# Patient Record
Sex: Male | Born: 1965 | Race: White | Hispanic: No | Marital: Single | Smoking: Never smoker
Health system: Southern US, Community
[De-identification: ages and names within clinical notes are randomized; demographics above are authoritative.]

---

## 2002-02-02 HISTORY — PX: HERNIA REPAIR: SHX51

## 2006-01-14 ENCOUNTER — Emergency Department (HOSPITAL_COMMUNITY): Admission: EM | Admit: 2006-01-14 | Discharge: 2006-01-14 | Payer: Self-pay | Admitting: Emergency Medicine

## 2018-06-06 ENCOUNTER — Encounter (HOSPITAL_COMMUNITY)
Admission: EM | Disposition: A | Discharge status: Home / Self Care | Payer: Self-pay | Source: Home / Self Care | Attending: Emergency Medicine

## 2018-06-06 ENCOUNTER — Encounter (HOSPITAL_COMMUNITY): Payer: Self-pay

## 2018-06-06 ENCOUNTER — Other Ambulatory Visit: Payer: Self-pay

## 2018-06-06 ENCOUNTER — Emergency Department (HOSPITAL_COMMUNITY): Payer: Self-pay

## 2018-06-06 ENCOUNTER — Emergency Department (HOSPITAL_COMMUNITY): Payer: Self-pay | Admitting: Certified Registered"

## 2018-06-06 ENCOUNTER — Observation Stay (HOSPITAL_COMMUNITY)
Admission: EM | Admit: 2018-06-06 | Discharge: 2018-06-07 | Disposition: A | Discharge status: Home / Self Care | Payer: Self-pay | Attending: Surgery | Admitting: Surgery

## 2018-06-06 DIAGNOSIS — K219 Gastro-esophageal reflux disease without esophagitis: Secondary | ICD-10-CM | POA: Insufficient documentation

## 2018-06-06 DIAGNOSIS — K3580 Unspecified acute appendicitis: Principal | ICD-10-CM | POA: Diagnosis present

## 2018-06-06 HISTORY — PX: LAPAROSCOPIC APPENDECTOMY: SHX408

## 2018-06-06 LAB — CBC
HCT: 41.7 % (ref 39.0–52.0)
Hemoglobin: 13.7 g/dL (ref 13.0–17.0)
MCH: 28.2 pg (ref 26.0–34.0)
MCHC: 32.9 g/dL (ref 30.0–36.0)
MCV: 85.8 fL (ref 80.0–100.0)
Platelets: 195 10*3/uL (ref 150–400)
RBC: 4.86 MIL/uL (ref 4.22–5.81)
RDW: 13 % (ref 11.5–15.5)
WBC: 11.7 10*3/uL — ABNORMAL HIGH (ref 4.0–10.5)
nRBC: 0 % (ref 0.0–0.2)

## 2018-06-06 LAB — URINALYSIS, ROUTINE W REFLEX MICROSCOPIC
Bilirubin Urine: NEGATIVE
Glucose, UA: NEGATIVE mg/dL
Hgb urine dipstick: NEGATIVE
Ketones, ur: NEGATIVE mg/dL
Leukocytes,Ua: NEGATIVE
Nitrite: NEGATIVE
Protein, ur: NEGATIVE mg/dL
Specific Gravity, Urine: 1.025 (ref 1.005–1.030)
pH: 5 (ref 5.0–8.0)

## 2018-06-06 LAB — COMPREHENSIVE METABOLIC PANEL
ALT: 17 U/L (ref 0–44)
AST: 17 U/L (ref 15–41)
Albumin: 4.3 g/dL (ref 3.5–5.0)
Alkaline Phosphatase: 48 U/L (ref 38–126)
Anion gap: 8 (ref 5–15)
BUN: 13 mg/dL (ref 6–20)
CO2: 25 mmol/L (ref 22–32)
Calcium: 9.1 mg/dL (ref 8.9–10.3)
Chloride: 104 mmol/L (ref 98–111)
Creatinine, Ser: 1.04 mg/dL (ref 0.61–1.24)
GFR calc Af Amer: >60 mL/min (ref >60)
GFR calc non Af Amer: >60 mL/min (ref >60)
Glucose, Bld: 103 mg/dL — ABNORMAL HIGH (ref 70–99)
Potassium: 3.9 mmol/L (ref 3.5–5.1)
Sodium: 137 mmol/L (ref 135–145)
Total Bilirubin: 0.6 mg/dL (ref 0.3–1.2)
Total Protein: 7.6 g/dL (ref 6.5–8.1)

## 2018-06-06 LAB — LIPASE, BLOOD: Lipase: 27 U/L (ref 11–51)

## 2018-06-06 SURGERY — APPENDECTOMY, LAPAROSCOPIC
Anesthesia: General | Site: Abdomen

## 2018-06-06 MED ORDER — MIDAZOLAM HCL 2 MG/2ML IJ SOLN
INTRAMUSCULAR | Status: DC | PRN
  Administered 2018-06-06: 2 mg via INTRAVENOUS

## 2018-06-06 MED ORDER — SUGAMMADEX SODIUM 200 MG/2ML IV SOLN
INTRAVENOUS | Status: AC
  Filled 2018-06-06: qty 2

## 2018-06-06 MED ORDER — ONDANSETRON 4 MG PO TBDP: 4.0000 mg | ORAL_TABLET | Freq: Four times a day (QID) | ORAL | Status: DC | PRN

## 2018-06-06 MED ORDER — BUPIVACAINE-EPINEPHRINE 0.5% -1:200000 IJ SOLN
INTRAMUSCULAR | Status: AC
  Filled 2018-06-06: qty 1

## 2018-06-06 MED ORDER — DEXAMETHASONE SODIUM PHOSPHATE 10 MG/ML IJ SOLN
INTRAMUSCULAR | Status: DC | PRN
  Administered 2018-06-06: 10 mg via INTRAVENOUS

## 2018-06-06 MED ORDER — SODIUM CHLORIDE 0.9 % IV SOLN
2.0000 g | Freq: Once | INTRAVENOUS | Status: AC
  Administered 2018-06-06: 2 g via INTRAVENOUS
  Filled 2018-06-06: qty 20

## 2018-06-06 MED ORDER — ONDANSETRON HCL 4 MG/2ML IJ SOLN
INTRAMUSCULAR | Status: AC
  Filled 2018-06-06: qty 2

## 2018-06-06 MED ORDER — LIDOCAINE 2% (20 MG/ML) 5 ML SYRINGE
INTRAMUSCULAR | Status: AC
  Filled 2018-06-06: qty 5

## 2018-06-06 MED ORDER — BISACODYL 10 MG RE SUPP: 10.0000 mg | Freq: Every day | RECTAL | Status: DC | PRN

## 2018-06-06 MED ORDER — HYDROCODONE-ACETAMINOPHEN 5-325 MG PO TABS: 1.0000 | ORAL_TABLET | ORAL | Status: DC | PRN

## 2018-06-06 MED ORDER — ACETAMINOPHEN 500 MG PO TABS
1000.0000 mg | ORAL_TABLET | Freq: Four times a day (QID) | ORAL | Status: DC
  Administered 2018-06-06 – 2018-06-07 (×3): 1000 mg via ORAL
  Filled 2018-06-06 (×3): qty 2

## 2018-06-06 MED ORDER — FENTANYL CITRATE (PF) 100 MCG/2ML IJ SOLN
INTRAMUSCULAR | Status: AC
  Filled 2018-06-06: qty 2

## 2018-06-06 MED ORDER — MORPHINE SULFATE (PF) 4 MG/ML IV SOLN
2.0000 mg | INTRAVENOUS | Status: DC | PRN
  Administered 2018-06-06: 21:00:00 2 mg via INTRAVENOUS
  Administered 2018-06-07: 4 mg via INTRAVENOUS
  Filled 2018-06-06 (×2): qty 1

## 2018-06-06 MED ORDER — SODIUM CHLORIDE 0.9% FLUSH
3.0000 mL | Freq: Once | INTRAVENOUS | Status: AC
  Administered 2018-06-06: 3 mL via INTRAVENOUS

## 2018-06-06 MED ORDER — ONDANSETRON HCL 4 MG/2ML IJ SOLN
INTRAMUSCULAR | Status: DC | PRN
  Administered 2018-06-06: 4 mg via INTRAVENOUS

## 2018-06-06 MED ORDER — PROPOFOL 10 MG/ML IV BOLUS
INTRAVENOUS | Status: AC
  Filled 2018-06-06: qty 20

## 2018-06-06 MED ORDER — FENTANYL CITRATE (PF) 100 MCG/2ML IJ SOLN: 25.0000 ug | INTRAMUSCULAR | Status: DC | PRN

## 2018-06-06 MED ORDER — GABAPENTIN 300 MG PO CAPS
300.0000 mg | ORAL_CAPSULE | Freq: Two times a day (BID) | ORAL | Status: DC
  Administered 2018-06-06 – 2018-06-07 (×2): 300 mg via ORAL
  Filled 2018-06-06 (×2): qty 1

## 2018-06-06 MED ORDER — SUGAMMADEX SODIUM 500 MG/5ML IV SOLN
INTRAVENOUS | Status: AC
  Filled 2018-06-06: qty 5

## 2018-06-06 MED ORDER — PROPOFOL 10 MG/ML IV BOLUS
INTRAVENOUS | Status: DC | PRN
  Administered 2018-06-06: 200 mg via INTRAVENOUS

## 2018-06-06 MED ORDER — SODIUM CHLORIDE (PF) 0.9 % IJ SOLN
INTRAMUSCULAR | Status: AC
  Filled 2018-06-06: qty 50

## 2018-06-06 MED ORDER — METRONIDAZOLE IN NACL 5-0.79 MG/ML-% IV SOLN
500.0000 mg | Freq: Three times a day (TID) | INTRAVENOUS | Status: AC
  Administered 2018-06-07: 03:00:00 500 mg via INTRAVENOUS
  Filled 2018-06-06: qty 100

## 2018-06-06 MED ORDER — IBUPROFEN 200 MG PO TABS: 600.0000 mg | ORAL_TABLET | Freq: Four times a day (QID) | ORAL | Status: DC | PRN

## 2018-06-06 MED ORDER — SIMETHICONE 80 MG PO CHEW: 40.0000 mg | CHEWABLE_TABLET | Freq: Four times a day (QID) | ORAL | Status: DC | PRN

## 2018-06-06 MED ORDER — BUPIVACAINE-EPINEPHRINE 0.5% -1:200000 IJ SOLN
INTRAMUSCULAR | Status: DC | PRN
  Administered 2018-06-06: 20 mL

## 2018-06-06 MED ORDER — METRONIDAZOLE IN NACL 5-0.79 MG/ML-% IV SOLN: 500.0000 mg | Freq: Three times a day (TID) | INTRAVENOUS | Status: DC

## 2018-06-06 MED ORDER — FENTANYL CITRATE (PF) 250 MCG/5ML IJ SOLN
INTRAMUSCULAR | Status: DC | PRN
  Administered 2018-06-06 (×2): 100 ug via INTRAVENOUS
  Administered 2018-06-06: 50 ug via INTRAVENOUS

## 2018-06-06 MED ORDER — METRONIDAZOLE IN NACL 5-0.79 MG/ML-% IV SOLN
500.0000 mg | Freq: Once | INTRAVENOUS | Status: AC
  Administered 2018-06-06: 500 mg via INTRAVENOUS
  Filled 2018-06-06: qty 100

## 2018-06-06 MED ORDER — ONDANSETRON HCL 4 MG/2ML IJ SOLN: 4.0000 mg | Freq: Four times a day (QID) | INTRAMUSCULAR | Status: DC | PRN

## 2018-06-06 MED ORDER — LACTATED RINGERS IV SOLN
INTRAVENOUS | Status: DC
  Administered 2018-06-06 (×2): via INTRAVENOUS

## 2018-06-06 MED ORDER — ROCURONIUM BROMIDE 10 MG/ML (PF) SYRINGE
PREFILLED_SYRINGE | INTRAVENOUS | Status: AC
  Filled 2018-06-06: qty 10

## 2018-06-06 MED ORDER — ROCURONIUM BROMIDE 10 MG/ML (PF) SYRINGE
PREFILLED_SYRINGE | INTRAVENOUS | Status: DC | PRN
  Administered 2018-06-06: 50 mg via INTRAVENOUS

## 2018-06-06 MED ORDER — SUCCINYLCHOLINE CHLORIDE 200 MG/10ML IV SOSY
PREFILLED_SYRINGE | INTRAVENOUS | Status: DC | PRN
  Administered 2018-06-06: 200 mg via INTRAVENOUS

## 2018-06-06 MED ORDER — SENNOSIDES-DOCUSATE SODIUM 8.6-50 MG PO TABS
1.0000 | ORAL_TABLET | Freq: Every day | ORAL | Status: DC
  Administered 2018-06-06: 1 via ORAL
  Filled 2018-06-06: qty 1

## 2018-06-06 MED ORDER — MIDAZOLAM HCL 2 MG/2ML IJ SOLN
INTRAMUSCULAR | Status: AC
  Filled 2018-06-06: qty 2

## 2018-06-06 MED ORDER — IOHEXOL 300 MG/ML  SOLN
100.0000 mL | Freq: Once | INTRAMUSCULAR | Status: AC | PRN
  Administered 2018-06-06: 100 mL via INTRAVENOUS

## 2018-06-06 MED ORDER — SODIUM CHLORIDE 0.9 % IV BOLUS: 500.0000 mL | Freq: Once | INTRAVENOUS | Status: DC

## 2018-06-06 MED ORDER — KCL IN DEXTROSE-NACL 20-5-0.45 MEQ/L-%-% IV SOLN
INTRAVENOUS | Status: DC
  Administered 2018-06-06: 23:00:00 via INTRAVENOUS
  Filled 2018-06-06: qty 1000

## 2018-06-06 MED ORDER — FENTANYL CITRATE (PF) 250 MCG/5ML IJ SOLN
INTRAMUSCULAR | Status: AC
  Filled 2018-06-06: qty 5

## 2018-06-06 MED ORDER — ENOXAPARIN SODIUM 40 MG/0.4ML ~~LOC~~ SOLN
40.0000 mg | SUBCUTANEOUS | Status: DC
  Administered 2018-06-07: 08:00:00 40 mg via SUBCUTANEOUS
  Filled 2018-06-06: qty 0.4

## 2018-06-06 MED ORDER — SUGAMMADEX SODIUM 200 MG/2ML IV SOLN
INTRAVENOUS | Status: DC | PRN
  Administered 2018-06-06: 350 mg via INTRAVENOUS

## 2018-06-06 MED ORDER — LIDOCAINE 2% (20 MG/ML) 5 ML SYRINGE
INTRAMUSCULAR | Status: DC | PRN
  Administered 2018-06-06: 80 mg via INTRAVENOUS

## 2018-06-06 MED ORDER — DEXAMETHASONE SODIUM PHOSPHATE 10 MG/ML IJ SOLN
INTRAMUSCULAR | Status: AC
  Filled 2018-06-06: qty 1

## 2018-06-06 SURGICAL SUPPLY — 41 items
APPLIER CLIP 5 13 M/L LIGAMAX5 (MISCELLANEOUS)
APPLIER CLIP ROT 10 11.4 M/L (STAPLE)
CABLE HIGH FREQUENCY MONO STRZ (ELECTRODE) ×3 IMPLANT
CHLORAPREP W/TINT 26 (MISCELLANEOUS) ×3 IMPLANT
CLIP APPLIE 5 13 M/L LIGAMAX5 (MISCELLANEOUS) IMPLANT
CLIP APPLIE ROT 10 11.4 M/L (STAPLE) IMPLANT
COVER WAND RF STERILE (DRAPES) IMPLANT
DECANTER SPIKE VIAL GLASS SM (MISCELLANEOUS) ×3 IMPLANT
DERMABOND ADVANCED (GAUZE/BANDAGES/DRESSINGS) ×2
DERMABOND ADVANCED .7 DNX12 (GAUZE/BANDAGES/DRESSINGS) ×1 IMPLANT
DRAPE LAPAROSCOPIC ABDOMINAL (DRAPES) IMPLANT
ELECT REM PT RETURN 15FT ADLT (MISCELLANEOUS) ×3 IMPLANT
GLOVE BIO SURGEON STRL SZ 6.5 (GLOVE) ×2 IMPLANT
GLOVE BIO SURGEONS STRL SZ 6.5 (GLOVE) ×1
GLOVE BIOGEL PI IND STRL 7.0 (GLOVE) ×1 IMPLANT
GLOVE BIOGEL PI INDICATOR 7.0 (GLOVE) ×2
GOWN STRL REUS W/TWL 2XL LVL3 (GOWN DISPOSABLE) ×3 IMPLANT
GOWN STRL REUS W/TWL XL LVL3 (GOWN DISPOSABLE) ×3 IMPLANT
GRASPER SUT TROCAR 14GX15 (MISCELLANEOUS) IMPLANT
HANDLE STAPLE EGIA 4 XL (STAPLE) ×3 IMPLANT
IRRIG SUCT STRYKERFLOW 2 WTIP (MISCELLANEOUS) ×3
IRRIGATION SUCT STRKRFLW 2 WTP (MISCELLANEOUS) ×1 IMPLANT
KIT BASIN OR (CUSTOM PROCEDURE TRAY) ×3 IMPLANT
KIT TURNOVER KIT A (KITS) IMPLANT
MARKER SKIN DUAL TIP RULER LAB (MISCELLANEOUS) IMPLANT
POUCH SPECIMEN RETRIEVAL 10MM (ENDOMECHANICALS) IMPLANT
RELOAD EGIA 45 MED/THCK PURPLE (STAPLE) ×3 IMPLANT
RELOAD EGIA 45 TAN VASC (STAPLE) ×3 IMPLANT
RELOAD EGIA 60 MED/THCK PURPLE (STAPLE) IMPLANT
RELOAD EGIA 60 TAN VASC (STAPLE) IMPLANT
SCISSORS LAP 5X35 DISP (ENDOMECHANICALS) IMPLANT
SLEEVE XCEL OPT CAN 5 100 (ENDOMECHANICALS) IMPLANT
SUT VIC AB 2-0 SH 27 (SUTURE)
SUT VIC AB 2-0 SH 27X BRD (SUTURE) IMPLANT
SUT VIC AB 4-0 PS2 27 (SUTURE) ×3 IMPLANT
SUT VICRYL 0 UR6 27IN ABS (SUTURE) IMPLANT
TOWEL OR 17X26 10 PK STRL BLUE (TOWEL DISPOSABLE) ×3 IMPLANT
TRAY FOLEY MTR SLVR 16FR STAT (SET/KITS/TRAYS/PACK) ×3 IMPLANT
TRAY LAPAROSCOPIC (CUSTOM PROCEDURE TRAY) ×3 IMPLANT
TROCAR BLADELESS OPT 5 100 (ENDOMECHANICALS) IMPLANT
TROCAR XCEL BLUNT TIP 100MML (ENDOMECHANICALS) ×3 IMPLANT

## 2018-06-06 NOTE — ED Triage Notes (Signed)
Pt states that he is having generalized abdominal pain, that has since moved to RLQ pain. Pt is concerned for appendicitis. Pain started yesterday at 2100.

## 2018-06-06 NOTE — Progress Notes (Signed)
Patient arrived to room 1519 at 2007 via stretcher accompanied by PACU personnel and received bedside report. Patient alert and oriented x4. Complaints of abdominal pain 4/10 on pain scale. SCD's reapplied. PIV to left arm patent and receiving IVF's LR. Abdomen with three ports open to air with surgical glue intact. NT in to obtain VS's. Requested for pain to call for assistance for elimination needs. Safety measures initiated: bed alarm on, bed low position, call light within reach, and SR up x 2.

## 2018-06-06 NOTE — Anesthesia Preprocedure Evaluation (Addendum)
Anesthesia Evaluation  Patient identified by MRN, date of birth, ID band Patient awake    Reviewed: Allergy & Precautions, NPO status , Patient's Chart, lab work & pertinent test results  History of Anesthesia Complications Negative for: history of anesthetic complications  Airway Mallampati: II  TM Distance: >3 FB Neck ROM: Full    Dental  (+) Dental Advisory Given, Chipped,    Pulmonary neg pulmonary ROS,    Pulmonary exam normal breath sounds clear to auscultation       Cardiovascular negative cardio ROS Normal cardiovascular exam Rhythm:Regular Rate:Normal     Neuro/Psych negative neurological ROS     GI/Hepatic Neg liver ROS, GERD  Medicated and Controlled,Acute appendicitis   Endo/Other  negative endocrine ROS  Renal/GU negative Renal ROS     Musculoskeletal negative musculoskeletal ROS (+)   Abdominal   Peds  Hematology negative hematology ROS (+)   Anesthesia Other Findings Day of surgery medications reviewed with the patient.  Reproductive/Obstetrics                            Anesthesia Physical Anesthesia Plan  ASA: II  Anesthesia Plan: General   Post-op Pain Management:    Induction: Intravenous, Rapid sequence and Cricoid pressure planned  PONV Risk Score and Plan: 3 and Treatment may vary due to age or medical condition, Ondansetron, Dexamethasone and Midazolam  Airway Management Planned: Oral ETT  Additional Equipment: None  Intra-op Plan:   Post-operative Plan: Extubation in OR  Informed Consent: I have reviewed the patients History and Physical, chart, labs and discussed the procedure including the risks, benefits and alternatives for the proposed anesthesia with the patient or authorized representative who has indicated his/her understanding and acceptance.     Dental advisory given  Plan Discussed with: CRNA  Anesthesia Plan Comments:         Anesthesia Quick Evaluation

## 2018-06-06 NOTE — ED Notes (Signed)
[facility] CONTACT INFORMATION [phone] PLEASE CALL AFTER SURGERY

## 2018-06-06 NOTE — ED Provider Notes (Signed)
[facility]-EMERGENCY DEPT Provider Note   CSN: [id number] Arrival date & time: 06/06/18  1310    History   Chief Complaint Chief Complaint  Patient presents with   Abdominal Pain    HPI William Chavez is a 53 y.o. male.     HPI  Patient is a 53 year old male with a past surgical history of hernia repair 2004 but no significant past medical history presenting for generalized abdominal pain that progressed to the right lower quadrant today.  He reports that his pain began rather suddenly around 9 PM last night.  He reports his pain was generalized and at various points throughout the night it was crampy, sharp, and dull.  Reports he was unable to get much sleep.  He vomited 1 time, nonbilious nonbloody.  Patient reports that today his pain progressed to the right lower quadrant.  He reports that the overall level of his pain improved, and if he does not move he is comfortable.  Reports last bowel movement was in the middle last night and normal for patient without melena or hematochezia.  He is passing gas.  Denies fevers or chills.  Took ibuprofen 2 hours ago which helped his symptoms.  History reviewed. No pertinent past medical history.  There are no active problems to display for this patient.   Past Surgical History:  Procedure Laterality Date   HERNIA REPAIR  2004        Home Medications    Prior to Admission medications   Not on File    Family History No family history on file.  Social History Social History   Tobacco Use   Smoking status: Never Smoker   Smokeless tobacco: Never Used  Substance Use Topics   Alcohol use: Never    Frequency: Never   Drug use: Never     Allergies   Patient has no known allergies.   Review of Systems Review of Systems  Constitutional: Negative for chills and fever.  HENT: Negative for congestion and sore throat.   Eyes: Negative for visual disturbance.  Respiratory: Negative for cough,  chest tightness and shortness of breath.   Cardiovascular: Negative for chest pain.  Gastrointestinal: Positive for abdominal pain, nausea and vomiting. Negative for blood in stool, constipation and diarrhea.  Genitourinary: Negative for difficulty urinating, dysuria, flank pain and frequency.  Musculoskeletal: Negative for back pain and myalgias.  Skin: Negative for rash.  Neurological: Negative for dizziness, syncope and headaches.     Physical Exam Updated Vital Signs BP 138/82 (BP Location: Right Arm)    Pulse 67    Temp 99.3 F (37.4 C) (Oral)    Resp 16    Ht 6' (1.829 m)    Wt 95.3 kg    SpO2 100%    BMI 28.48 kg/m   Physical Exam Vitals signs and nursing note reviewed.  Constitutional:      General: He is not in acute distress.    Appearance: He is well-developed.  HENT:     Head: Normocephalic and atraumatic.  Eyes:     Conjunctiva/sclera: Conjunctivae normal.     Pupils: Pupils are equal, round, and reactive to light.  Neck:     Musculoskeletal: Normal range of motion and neck supple.  Cardiovascular:     Rate and Rhythm: Normal rate and regular rhythm.     Heart sounds: S1 normal and S2 normal. No murmur.  Pulmonary:     Effort: Pulmonary effort is normal.  Breath sounds: Normal breath sounds. No wheezing or rales.  Abdominal:     General: There is no distension.     Palpations: Abdomen is soft.     Tenderness: There is abdominal tenderness in the right lower quadrant. There is no guarding or rebound. Positive signs include Rovsing's sign and McBurney's sign.  Musculoskeletal: Normal range of motion.        General: No deformity.  Lymphadenopathy:     Cervical: No cervical adenopathy.  Skin:    General: Skin is warm and dry.     Findings: No erythema or rash.  Neurological:     Mental Status: He is alert.     Comments: Cranial nerves grossly intact. Patient moves extremities symmetrically and with good coordination.  Psychiatric:        Behavior:  Behavior normal.        Thought Content: Thought content normal.        Judgment: Judgment normal.      ED Treatments / Results  Labs (all labs ordered are listed, but only abnormal results are displayed) Labs Reviewed  COMPREHENSIVE METABOLIC PANEL - Abnormal; Notable for the following components:      Result Value   Glucose, Bld 103 (*)    All other components within normal limits  CBC - Abnormal; Notable for the following components:   WBC 11.7 (*)    All other components within normal limits  URINALYSIS, ROUTINE W REFLEX MICROSCOPIC - Abnormal; Notable for the following components:   APPearance HAZY (*)    All other components within normal limits  LIPASE, BLOOD    EKG None  Radiology Ct Abdomen Pelvis W Contrast  Result Date: 06/06/2018 CLINICAL DATA:  Generalized abdominal pain yesterday which since moved to the right lower quadrant. Concern for appendicitis. EXAM: CT ABDOMEN AND PELVIS WITH CONTRAST TECHNIQUE: Multidetector CT imaging of the abdomen and pelvis was performed using the standard protocol following bolus administration of intravenous contrast. CONTRAST:  100mL OMNIPAQUE IOHEXOL 300 MG/ML  SOLN COMPARISON:  None. FINDINGS: Lower chest: Lung bases demonstrate subtle dependent left basilar atelectasis. Hepatobiliary: Liver, gallbladder and biliary tree are normal. Pancreas: Normal. Spleen: Normal. Adrenals/Urinary Tract: Adrenal glands are normal. Kidneys are normal in size without hydronephrosis or nephrolithiasis. Ureters and bladder are normal. Stomach/Bowel: Stomach is normal. Minimal wall thickening of the terminal ileum likely secondary to the adjacent inflammatory process of the appendix. Colon is unremarkable. Appendix is inflamed measuring up to 1.4 cm in diameter. Appendix is located within the right lower quadrant and demonstrates mild adjacent inflammatory change with mucosal enhancement and minimal adjacent free fluid. There is a 4-5 mm appendicolith in the  mid appendix. No evidence of perforation or periappendiceal abscess. Vascular/Lymphatic: Normal. Reproductive: Normal. Other: None. Musculoskeletal: Unremarkable. IMPRESSION: Evidence of acute appendicitis. No evidence of perforation or periappendiceal abscess. These results were called by telephone at the time of interpretation on 06/06/2018 at 4:36 pm to Dr. Rhunette CroftNanavati, who verbally acknowledged these results. Electronically Signed   By: Elberta Fortisaniel  Boyle M.D.   On: 06/06/2018 16:36    Procedures Procedures (including critical care time)  Medications Ordered in ED Medications  sodium chloride (PF) 0.9 % injection (has no administration in time range)  cefTRIAXone (ROCEPHIN) 2 g in sodium chloride 0.9 % 100 mL IVPB (has no administration in time range)    And  metroNIDAZOLE (FLAGYL) IVPB 500 mg (has no administration in time range)  sodium chloride 0.9 % bolus 500 mL (has no administration in time  range)  sodium chloride flush (NS) 0.9 % injection 3 mL (3 mLs Intravenous Given 06/06/18 1408)  iohexol (OMNIPAQUE) 300 MG/ML solution 100 mL (100 mLs Intravenous Contrast Given 06/06/18 1610)     Initial Impression / Assessment and Plan / ED Course  I have reviewed the triage vital signs and the nursing notes.  Pertinent labs & imaging results that were available during my care of the patient were reviewed by me and considered in my medical decision making (see chart for details).  Clinical Course as of Jun 05 1852  Mon Jun 06, 2018  1414 Pt declines analgesia at this time.    [AM]  1526 WBC(!): 11.7 [AM]  1634 Spoke with Dr. Magnus Ivan who reviewed scan and saw + for appendicitis. Will admit. Appreciate his involvement.    [AM]  1640 Showing appy. Pt updated. Pt given antibiotics. Pt still declines pain medication.   CT Abdomen Pelvis W Contrast [AM]    Clinical Course User Index [AM] Elisha Ponder, PA-C       Patient nontoxic-appearing, afebrile, and hemodynamically stable.  Patient with  focal right lower quadrant tenderness per differential diagnosis includes appendicitis, diverticulitis, cholecystitis, incarcerated hernia, gastroenteritis. Patient declining pain medication at time of evaluation.   Lab work demonstrating slight leukocytosis.  CT the abdomen and pelvis with contrast demonstrates 1.4 cm dilatation of the appendix with localized inflammation.  No perforation or abscess.  Patient evaluated by Dr. Magnus Ivan of general surgery who will arrange for appendectomy.  Appreciate his involvement.  This is a supervised visit with Dr. Derwood Kaplan. Evaluation, management, and discharge planning discussed with this attending physician.  Final Clinical Impressions(s) / ED Diagnoses   Final diagnoses:  Acute appendicitis, unspecified acute appendicitis type    ED Discharge Orders    None       Delia Chimes 06/06/18 Colon Flattery, MD 06/07/18 1554

## 2018-06-06 NOTE — Anesthesia Procedure Notes (Signed)
Procedure Name: Intubation Date/Time: 06/06/2018 6:38 PM Performed by: Lissa Morales, CRNA Pre-anesthesia Checklist: Patient identified, Emergency Drugs available, Suction available and Patient being monitored Patient Re-evaluated:Patient Re-evaluated prior to induction Oxygen Delivery Method: Circle system utilized Preoxygenation: Pre-oxygenation with 100% oxygen Induction Type: IV induction, Cricoid Pressure applied and Rapid sequence Laryngoscope Size: Mac and 4 Grade View: Grade II Tube type: Oral Tube size: 8.0 mm Number of attempts: 1 Airway Equipment and Method: Stylet and Oral airway Placement Confirmation: ETT inserted through vocal cords under direct vision,  positive ETCO2 and breath sounds checked- equal and bilateral Secured at: 23 cm Tube secured with: Tape Dental Injury: Teeth and Oropharynx as per pre-operative assessment

## 2018-06-06 NOTE — Anesthesia Postprocedure Evaluation (Signed)
Anesthesia Post Note  Patient: William Chavez  Procedure(s) Performed: APPENDECTOMY LAPAROSCOPIC (N/A Abdomen)     Patient location during evaluation: PACU Anesthesia Type: General Level of consciousness: awake and alert Pain management: pain level controlled Vital Signs Assessment: post-procedure vital signs reviewed and stable Respiratory status: spontaneous breathing, nonlabored ventilation, respiratory function stable and patient connected to nasal cannula oxygen Cardiovascular status: blood pressure returned to baseline and stable Postop Assessment: no apparent nausea or vomiting Anesthetic complications: no    Last Vitals:  Vitals:   06/06/18 1930 06/06/18 1942  BP: (!) 170/95 (!) 156/94  Pulse: (!) 101 91  Resp: 16 15  Temp: 37.3 C   SpO2: 99% 99%    Last Pain:  Vitals:   06/06/18 1942  TempSrc:   PainSc: 0-No pain                 Novalyn Lajara L Rhyder Koegel

## 2018-06-06 NOTE — H&P (Signed)
William Chavez is an 53 y.o. male.   Chief Complaint: RLQ abdominal pain HPI: This is an otherwise healthy 53 year old gentleman who presents with abdominal pain.  He reports he started having vague diffuse abdominal pain yesterday at 7 PM.  It is now localized to the right lower quadrant.  He had an episode of emesis yesterday and some nausea.  This has improved.  Bowel moods have been normal.  He denies fevers or chills.  He denies cough.  He is otherwise without complaints.  History reviewed. No pertinent past medical history.  Past Surgical History:  Procedure Laterality Date  . HERNIA REPAIR  2004    No family history on file. Social History:  reports that he has never smoked. He has never used smokeless tobacco. He reports that he does not drink alcohol or use drugs.  Allergies: No Known Allergies  (Not in a hospital admission)   Results for orders placed or performed during the hospital encounter of 06/06/18 (from the past 48 hour(s))  Urinalysis, Routine w reflex microscopic     Status: Abnormal   Collection Time: 06/06/18  1:22 PM  Result Value Ref Range   Color, Urine YELLOW YELLOW   APPearance HAZY (A) CLEAR   Specific Gravity, Urine 1.025 1.005 - 1.030   pH 5.0 5.0 - 8.0   Glucose, UA NEGATIVE NEGATIVE mg/dL   Hgb urine dipstick NEGATIVE NEGATIVE   Bilirubin Urine NEGATIVE NEGATIVE   Ketones, ur NEGATIVE NEGATIVE mg/dL   Protein, ur NEGATIVE NEGATIVE mg/dL   Nitrite NEGATIVE NEGATIVE   Leukocytes,Ua NEGATIVE NEGATIVE    Comment: Performed at [facility], [address], [place], [place] [address]  Lipase, blood     Status: None   Collection Time: 06/06/18  2:59 PM  Result Value Ref Range   Lipase 27 11 - 51 U/L    Comment: Performed at [facility], [address], [place], [place] [address]  Comprehensive metabolic panel     Status: Abnormal   Collection Time: 06/06/18  2:59 PM  Result Value Ref Range   Sodium 137 135 - 145  mmol/L   Potassium 3.9 3.5 - 5.1 mmol/L   Chloride 104 98 - 111 mmol/L   CO2 25 22 - 32 mmol/L   Glucose, Bld 103 (H) 70 - 99 mg/dL   BUN 13 6 - 20 mg/dL   Creatinine, Ser 1.91 0.61 - 1.24 mg/dL   Calcium 9.1 8.9 - 47.8 mg/dL   Total Protein 7.6 6.5 - 8.1 g/dL   Albumin 4.3 3.5 - 5.0 g/dL   AST 17 15 - 41 U/L   ALT 17 0 - 44 U/L   Alkaline Phosphatase 48 38 - 126 U/L   Total Bilirubin 0.6 0.3 - 1.2 mg/dL   GFR calc non Af Amer >60 >60 mL/min   GFR calc Af Amer >60 >60 mL/min   Anion gap 8 5 - 15    Comment: Performed at [facility], [address] [place], [place] [address]  CBC     Status: Abnormal   Collection Time: 06/06/18  2:59 PM  Result Value Ref Range   WBC 11.7 (H) 4.0 - 10.5 K/uL   RBC 4.86 4.22 - 5.81 MIL/uL   Hemoglobin 13.7 13.0 - 17.0 g/dL   HCT 13.0 86.5 - 78.4 %   MCV 85.8 80.0 - 100.0 fL   MCH 28.2 26.0 - 34.0 pg   MCHC 32.9 30.0 - 36.0 g/dL  RDW 13.0 11.5 - 15.5 %   Platelets 195 150 - 400 K/uL   nRBC 0.0 0.0 - 0.2 %    Comment: Performed at [facility], [address] [place], [place] [id number]   Ct Abdomen Pelvis W Contrast  Result Date: 06/06/2018 CLINICAL DATA:  Generalized abdominal pain yesterday which since moved to the right lower quadrant. Concern for appendicitis. EXAM: CT ABDOMEN AND PELVIS WITH CONTRAST TECHNIQUE: Multidetector CT imaging of the abdomen and pelvis was performed using the standard protocol following bolus administration of intravenous contrast. CONTRAST:  100mL OMNIPAQUE IOHEXOL 300 MG/ML  SOLN COMPARISON:  None. FINDINGS: Lower chest: Lung bases demonstrate subtle dependent left basilar atelectasis. Hepatobiliary: Liver, gallbladder and biliary tree are normal. Pancreas: Normal. Spleen: Normal. Adrenals/Urinary Tract: Adrenal glands are normal. Kidneys are normal in size without hydronephrosis or nephrolithiasis. Ureters and bladder are normal. Stomach/Bowel: Stomach is normal. Minimal wall  thickening of the terminal ileum likely secondary to the adjacent inflammatory process of the appendix. Colon is unremarkable. Appendix is inflamed measuring up to 1.4 cm in diameter. Appendix is located within the right lower quadrant and demonstrates mild adjacent inflammatory change with mucosal enhancement and minimal adjacent free fluid. There is a 4-5 mm appendicolith in the mid appendix. No evidence of perforation or periappendiceal abscess. Vascular/Lymphatic: Normal. Reproductive: Normal. Other: None. Musculoskeletal: Unremarkable. IMPRESSION: Evidence of acute appendicitis. No evidence of perforation or periappendiceal abscess. These results were called by telephone at the time of interpretation on 06/06/2018 at 4:36 pm to Dr. Rhunette CroftNanavati, who verbally acknowledged these results. Electronically Signed   By: Elberta Fortisaniel  Boyle M.D.   On: 06/06/2018 16:36    Review of Systems  Constitutional: Negative for chills and fever.  Respiratory: Negative for cough and shortness of breath.   Cardiovascular: Negative for chest pain.  Gastrointestinal: Positive for abdominal pain, nausea and vomiting.  Genitourinary: Negative for dysuria.  All other systems reviewed and are negative.   Blood pressure 138/82, pulse 67, temperature 99.3 F (37.4 C), temperature source Oral, resp. rate 16, height 6' (1.829 m), weight 95.3 kg, SpO2 100 %. Physical Exam  Constitutional: He is oriented to person, place, and time. He appears well-developed and well-nourished. No distress.  HENT:  Head: Normocephalic and atraumatic.  Right Ear: External ear normal.  Left Ear: External ear normal.  Nose: Nose normal.  Mouth/Throat: Oropharynx is clear and moist. No oropharyngeal exudate.  Eyes: Pupils are equal, round, and reactive to light. Right eye exhibits no discharge. Left eye exhibits no discharge. No scleral icterus.  Neck: Normal range of motion. Neck supple. No tracheal deviation present.  Cardiovascular: Normal rate,  regular rhythm and normal heart sounds.  No murmur heard. Respiratory: Effort normal and breath sounds normal. No respiratory distress. He has no wheezes.  GI: Soft. Bowel sounds are normal. There is abdominal tenderness. There is guarding.  There is moderate tenderness and guarding in the right lower quadrant  Musculoskeletal: Normal range of motion.        General: No edema.  Neurological: He is alert and oriented to person, place, and time.  Skin: Skin is warm and dry. He is not diaphoretic. No erythema.  Psychiatric: His behavior is normal. Judgment normal.     Assessment/Plan Acute appendicitis  Reviewed the patient's CT scan.  This shows acute appendicitis without evidence of perforation.  I discussed the diagnosis with patient.  There is an appendicolith on CT scan.  An appendectomy is recommended.  I discussed the laparoscopic  technique.  I discussed the risks of surgery.  I have also discussed him with our on-call surgeon Dr. Romie Levee who will be performing the surgery.  IV antibiotics have been given.  Surgery is scheduled.  The patient agrees with the plan.  Abigail Miyamoto, MD 06/06/2018, 4:42 PM

## 2018-06-06 NOTE — Op Note (Signed)
William Chavez [id number]   PRE-OPERATIVE DIAGNOSIS:  appendicitis  POST-OPERATIVE DIAGNOSIS:  Acute appendicitis   Procedure(s): APPENDECTOMY LAPAROSCOPIC  SURGEON:  Surgeon(s): Romie Levee, MD  ASSISTANT: none   ANESTHESIA:   local and general  EBL:   69ml  Delay start of Pharmacological VTE agent (>24hrs) due to surgical blood loss or risk of bleeding:  no  DRAINS: none   SPECIMEN:  Source of Specimen:  appendix  DISPOSITION OF SPECIMEN:  PATHOLOGY  COUNTS:  YES  PLAN OF CARE: Admit for overnight observation  PATIENT DISPOSITION:  PACU - hemodynamically stable.   INDICATIONS: Patient with concerning symptoms & work up suspicious for appendicitis.  Surgery was recommended:  The anatomy & physiology of the digestive tract was discussed.  The pathophysiology of appendicitis was discussed.  Natural history risks without surgery was discussed.   I feel the risks of no intervention will lead to serious problems that outweigh the operative risks; therefore, I recommended diagnostic laparoscopy with removal of appendix to remove the pathology.  Laparoscopic & open techniques were discussed.   I noted a good likelihood this will help address the problem.    Risks such as bleeding, infection, abscess, leak, reoperation, possible ostomy, hernia, heart attack, death, and other risks were discussed.  Goals of post-operative recovery were discussed as well.  We will work to minimize complications.  Questions were answered.  The patient expresses understanding & wishes to proceed with surgery.  OR FINDINGS: acute appendicitis with some gangrene  DESCRIPTION:   The patient was identified & brought into the operating room. The patient was positioned supine with left arm tucked. SCDs were active during the entire case. The patient underwent general anesthesia without any difficulty.  A foley catheter was inserted under sterile conditions. The abdomen was prepped and draped in a  sterile fashion. A Surgical Timeout confirmed our plan.   I made a transverse incision through the superior umbilical fold.  I made a nick in the infraumbilical fascia and confirmed peritoneal entry.  I placed a stay suture and then the [facility] port.  We induced carbon dioxide insufflation.  Camera inspection revealed no injury.  I placed additional ports under direct laparoscopic visualization.  I mobilized the terminal ileum to proximal ascending colon in a lateral to medial fashion.  I took care to avoid injuring any retroperitoneal structures.   I freed the appendix off its attachments to the ascending colon and cecal mesentery.  I elevated the appendix.  I was able to free off the base of the appendix, which was still viable.  I stapled the appendix off the cecum using a laparoscopic blue load stapler.  I took a healthy cuff viable cecum. I skeletonized & ligated the mesoappendix with a white load stapler.  I placed the appendix inside an EndoCatch bag and removed out the [place] port.  I did copious irrigation. Hemostasis was good in the mesoappendix, colon mesentery, and retroperitoneum. Staple line was intact on the cecum with no bleeding. I washed out the pelvis, retrohepatic space and right paracolic gutter.  Hemostasis is good. There was no perforation or injury.  Because the area cleaned up well after irrigation, I did not place a drain.  I aspirated the carbon dioxide. I removed the ports. I closed the umbilical fascia site using a 0 Vicryl stitch. I closed skin using 4-0 vicryl stitch.  Sterile dressings were applied.  Patient was extubated and sent to the recovery room.  I discussed the operative findings with the  patient's family. I suspect the patient is going used in the hospital at least overnight and will need antibiotics for 0 days. Questions answered. They expressed understanding and appreciation.

## 2018-06-06 NOTE — Transfer of Care (Signed)
Immediate Anesthesia Transfer of Care Note  Patient: William Chavez  Procedure(s) Performed: APPENDECTOMY LAPAROSCOPIC (N/A Abdomen)  Patient Location: PACU  Anesthesia Type:General  Level of Consciousness: awake, alert , oriented and patient cooperative  Airway & Oxygen Therapy: Patient Spontanous Breathing and Patient connected to face mask oxygen  Post-op Assessment: Report given to RN, Post -op Vital signs reviewed and stable and Patient moving all extremities X 4  Post vital signs: stable  Last Vitals:  Vitals Value Taken Time  BP 170/95 06/06/2018  7:30 PM  Temp 37.3 C 06/06/2018  7:30 PM  Pulse 101 06/06/2018  7:30 PM  Resp 16 06/06/2018  7:30 PM  SpO2 99 % 06/06/2018  7:30 PM    Last Pain:  Vitals:   06/06/18 1750  TempSrc:   PainSc: 2          Complications: No apparent anesthesia complications

## 2018-06-06 NOTE — Progress Notes (Signed)
   Pt seen and examined and patient's CT scan reviewed.  This shows acute appendicitis without evidence of perforation.  I discussed the diagnosis with patient.  There is an appendicolith on CT scan.  An appendectomy is recommended.  I discussed the laparoscopic technique.  I discussed the risks of surgery.  IV antibiotics have been given. The patient agrees with the plan.  All questions were answered.  Vanita Panda, MD  Colorectal and General Surgery [facility]

## 2018-06-06 NOTE — ED Notes (Signed)
SURGERY Provider at bedside. 

## 2018-06-07 ENCOUNTER — Encounter (HOSPITAL_COMMUNITY): Payer: Self-pay | Admitting: General Surgery

## 2018-06-07 MED ORDER — IBUPROFEN 200 MG PO TABS: ORAL_TABLET | ORAL | Status: AC

## 2018-06-07 MED ORDER — TRAMADOL HCL 50 MG PO TABS
50.0000 mg | ORAL_TABLET | Freq: Four times a day (QID) | ORAL | Status: DC | PRN
  Administered 2018-06-07: 15:00:00 50 mg via ORAL
  Filled 2018-06-07: qty 1

## 2018-06-07 MED ORDER — ACETAMINOPHEN 500 MG PO TABS: ORAL_TABLET | ORAL | 0 refills | Status: DC

## 2018-06-07 MED ORDER — ACETAMINOPHEN 500 MG PO TABS: ORAL_TABLET | ORAL | 0 refills | Status: AC

## 2018-06-07 MED ORDER — TRAMADOL HCL 50 MG PO TABS: 50.0000 mg | ORAL_TABLET | Freq: Four times a day (QID) | ORAL | 0 refills | Status: AC | PRN

## 2018-06-07 NOTE — Discharge Summary (Signed)
Physician Discharge Summary  Patient ID: William Chavez MRN: [id number] DOB/AGE: 1965/07/13 52 y.o.  Admit date: 06/06/2018 Discharge date: 06/07/2018  Admission Diagnoses:  Acute appendicitis  Discharge Diagnoses:  Acute appendicitis  Active Problems:   Acute appendicitis   PROCEDURES: Laparoscopic appendectomy 06/06/2018, Dr. Cristi Loron Course:  This is an otherwise healthy 53 year old gentleman who presents with abdominal pain.  He reports he started having vague diffuse abdominal pain yesterday at 7 PM.  It is now localized to the right lower quadrant.  He had an episode of emesis yesterday and some nausea.  This has improved.  Bowel moods have been normal.  He denies fevers or chills.  He denies cough.  He is otherwise without complaints. Work-up in the ED included a CT scan of the abdomen pelvis with contrast.  This showed the appendix was inflamed measuring up to 1.4 cm in diameter.  It is in the right lower quadrant demonstrates mild adjacent inflammatory changes with mucosal enhancement and minimal adjacent free fluid.  There is a 4 to 5 mm appendicolith in the mid appendix.  No evidence of perforation or peri-appendiceal abscess.  Patient was seen in the emergency department by Dr. Abigail Miyamoto and taken to the operating room by Dr. Romie Levee.  He underwent laparoscopic appendectomy.  Patient tolerated the procedure well.  He returned to the floor.  His diet was advanced and he was ready for discharge on the first postoperative day.  Condition on discharge: Improved  CBC Latest Ref Rng & Units 06/06/2018  WBC 4.0 - 10.5 K/uL 11.7(H)  Hemoglobin 13.0 - 17.0 g/dL 03.2  Hematocrit 12.2 - 52.0 % 41.7  Platelets 150 - 400 K/uL 195   CMP Latest Ref Rng & Units 06/06/2018  Glucose 70 - 99 mg/dL 482(N)  BUN 6 - 20 mg/dL 13  Creatinine 0.03 - 7.04 mg/dL 8.88  Sodium 916 - 945 mmol/L 137  Potassium 3.5 - 5.1 mmol/L 3.9  Chloride 98 - 111 mmol/L 104  CO2 22 - 32  mmol/L 25  Calcium 8.9 - 10.3 mg/dL 9.1  Total Protein 6.5 - 8.1 g/dL 7.6  Total Bilirubin 0.3 - 1.2 mg/dL 0.6  Alkaline Phos 38 - 126 U/L 48  AST 15 - 41 U/L 17  ALT 0 - 44 U/L 17    Disposition: Discharge disposition: 01-Home or Self Care        Allergies as of 06/07/2018   No Known Allergies     Medication List    TAKE these medications   acetaminophen 500 MG tablet Commonly known as:  TYLENOL Take 1000 mg every 8 hours for the first 2 days. After that you can continue to take it every 8 hours as needed for pain. This is your first medication for pain control.  You can buy this over the counter at any drugstore.  Do not take more than 4000 mg of Tylenol(acetaminophen) per day.   famotidine 20 MG tablet Commonly known as:  PEPCID Take 20 mg by mouth 2 (two) times daily.   fluticasone 50 MCG/ACT nasal spray Commonly known as:  FLONASE Place 1 spray into both nostrils daily.   ibuprofen 200 MG tablet Commonly known as:  ADVIL You can take 2-3 tablets every 6 hours as needed for pain not relieved by plain Tylenol(acetaminophen).  This is your second medication for pain control.  You can buy this over the counter at any drugstore.  Do not exceed this limit.   traMADol 50 MG  tablet Commonly known as:  ULTRAM Take 1 tablet (50 mg total) by mouth every 6 (six) hours as needed for severe pain (pain not relieved by Tylenol & ibuprofen.).      Follow-up Information    [facility] on 06/21/2018.   Specialty:  General Surgery Why:  Your follow up will be a phone call appointment  (due to the [facility] virus, to limit exposure.) Hedda SladePuja Gosai will call you at 10:30 AM.  Please email a picture if you have an concerns with you wound to : [email or web address]  Contact information: [address] [place] [address] [phone]           Signed: Sherrie GeorgeJENNINGS,Chavon Lucarelli 06/07/2018, 4:00 PM

## 2018-06-07 NOTE — Discharge Instructions (Signed)
Appendicitis, Adult  The appendix is a tube in the body that is shaped like a finger. It is attached to the large intestine. Appendicitis means that this tube is swollen (inflamed). If this is not treated, the tube can tear (rupture). This can lead to a life-threatening infection. This condition can also cause pus to build up in the appendix (abscess). What are the causes? This condition may be caused by something that blocks the appendix. These include:  A ball of poop (stool).  Lymph glands that are bigger than normal. Sometimes the cause is not known. What increases the risk? You are more likely to develop this condition if you are between 10 and 30 years of age. What are the signs or symptoms? Symptoms of this condition include:  Pain around the belly button (navel). ? The pain moves toward the lower right belly (abdomen). ? The pain can get worse with time. ? The pain can get worse if you cough. ? The pain can get worse if you move suddenly.  Tenderness in the lower right belly.  Feeling sick to your stomach (nauseous).  Throwing up (vomiting).  Not feeling hungry (loss of appetite).  A fever.  Having trouble pooping (constipation).  Watery poop (diarrhea).  Not feeling well. How is this treated? Most often, this condition is treated by taking out the appendix (appendectomy). There are two ways to do this:  Open surgery. For this method, the appendix is taken out through a large cut (incision). The cut is made in the lower right belly. This surgery may be used if: ? You have scars from another surgery. ? You have a bleeding condition. ? You are pregnant and will be having your baby soon. ? You have a condition that makes it hard to do the other type of surgery.  Laparoscopic surgery. For this method, the appendix is taken out through small cuts. Often, this surgery: ? Causes less pain. ? Causes fewer problems. ? Is easier to heal from. If your appendix tears and  pus forms:  A drain may be put into the sore. The drain will be used to get rid of the pus.  You may get an antibiotic medicine through an IV line.  Your appendix may or may not need to be taken out. Follow these instructions at home: If you had surgery, follow instructions from your doctor on how to care for yourself at home and how to take care of your cut from surgery. Medicines  Take over-the-counter and prescription medicines only as told by your doctor.  If you were prescribed an antibiotic medicine, take it as told by your doctor. Do not stop taking the antibiotic even if you start to feel better. Eating and drinking Follow instructions from your doctor about what you cannot eat or drink. You may go back to your diet slowly if:  You no longer feel sick to your stomach.  You have stopped throwing up. General instructions  Do not use any products that contain nicotine or tobacco, such as cigarettes, e-cigarettes, and chewing tobacco. If you need help quitting, ask your doctor.  Do not drive or use heavy machinery while taking prescription pain medicine.  Ask your doctor if the medicine you are taking can cause trouble pooping. You may need to take steps to prevent or treat trouble pooping: ? Drink enough fluid to keep your pee (urine) pale yellow. ? Take over-the-counter or prescription medicines. ? Eat foods that are high in fiber. These include beans,   whole grains, and fresh fruits and vegetables. ? Limit foods that are high in fat and sugar. These include fried or sweet foods.  Keep all follow-up visits as told by your doctor. This is important. Contact a doctor if:  There is pus, blood, or a lot of fluid coming from your cut or cuts from surgery.  You are sick to your stomach or you throw up. Get help right away if:  You have pain in your belly, and the pain is getting worse.  You have a fever.  You have chills.  You are very tired.  You have muscle  pain.  You are short of breath. Summary  Appendicitis is swelling of the appendix. The appendix is a tube that is shaped like a finger. It is joined to the large intestine.  This condition may be caused by something that blocks the appendix. This can lead to an infection.  This condition is usually treated by taking out the appendix. This information is not intended to replace advice given to you by your health care provider. Make sure you discuss any questions you have with your health care provider. Document Released: 04/13/2011 Document Revised: 07/07/2017 Document Reviewed: 07/07/2017 Elsevier Interactive Patient Education  2019 [employer].  CCS ______[facility] LAPAROSCOPIC SURGERY: POST OP INSTRUCTIONS Always review your discharge instruction sheet given to you by the facility where your surgery was performed. IF YOU HAVE DISABILITY OR FAMILY LEAVE FORMS, YOU MUST BRING THEM TO THE OFFICE FOR PROCESSING.   DO NOT GIVE THEM TO YOUR DOCTOR.  1. A prescription for pain medication may be given to you upon discharge.  Take your pain medication as prescribed, if needed.  If narcotic pain medicine is not needed, then you may take acetaminophen (Tylenol) or ibuprofen (Advil) as needed. 2. Take your usually prescribed medications unless otherwise directed. 3. If you need a refill on your pain medication, please contact your pharmacy.  They will contact our office to request authorization. Prescriptions will not be filled after 5pm or on week-ends. 4. You should follow a light diet the first few days after arrival home, such as soup and crackers, etc.  Be sure to include lots of fluids daily. 5. Most patients will experience some swelling and bruising in the area of the incisions.  Ice packs will help.  Swelling and bruising can take several days to resolve.  6. It is common to experience some constipation if taking pain medication after surgery.  Increasing fluid intake  and taking a stool softener (such as Colace) will usually help or prevent this problem from occurring.  A mild laxative (Milk of Magnesia or Miralax) should be taken according to package instructions if there are no bowel movements after 48 hours. 7. Unless discharge instructions indicate otherwise, you may remove your bandages 24-48 hours after surgery, and you may shower at that time.  You may have steri-strips (small skin tapes) in place directly over the incision.  These strips should be left on the skin for 7-10 days.  If your surgeon used skin glue on the incision, you may shower in 24 hours.  The glue will flake off over the next 2-3 weeks.  Any sutures or staples will be removed at the office during your follow-up visit. 8. ACTIVITIES:  You may resume regular (light) daily activities beginning the next day--such as daily self-care, walking, climbing stairs--gradually increasing activities as tolerated.  You may have sexual intercourse when it is comfortable.  Refrain from  any heavy lifting or straining until approved by your doctor. a. You may drive when you are no longer taking prescription pain medication, you can comfortably wear a seatbelt, and you can safely maneuver your car and apply brakes. b. RETURN TO WORK:  __________________________________________________________ 9. You should see your doctor in the office for a follow-up appointment approximately 2-3 weeks after your surgery.  Make sure that you call for this appointment within a day or two after you arrive home to insure a convenient appointment time. 10. OTHER INSTRUCTIONS: __________________________________________________________________________________________________________________________ __________________________________________________________________________________________________________________________ WHEN TO CALL YOUR DOCTOR: 1. Fever over 101.0 2. Inability to urinate 3. Continued bleeding from  incision. 4. Increased pain, redness, or drainage from the incision. 5. Increasing abdominal pain  The clinic staff is available to answer your questions during regular business hours.  Please dont hesitate to call and ask to speak to one of the nurses for clinical concerns.  If you have a medical emergency, go to the nearest emergency room or call 911.  A surgeon from [facility] is always on call at the hospital. [address], [address], [place], [place]  [address] ? [address] [address], [place], [place]   [address] [phone] ? [phone] ? FAX [phone] Web site: [email or web address]

## 2018-06-07 NOTE — Progress Notes (Signed)
1 Day Post-Op    HL:KTGYBWLSL pain  Subjective: He feels much better, just had clears for breakfast, has not been OOB.  Sites look fine.    Objective: Vital signs in last 24 hours: Temp:  [98 F (36.7 C)-99.3 F (37.4 C)] 98.2 F (36.8 C) (05/05 0634) Pulse Rate:  [65-101] 65 (05/05 0634) Resp:  [12-20] 20 (05/05 0634) BP: (107-170)/(68-95) 107/68 (05/05 0634) SpO2:  [98 %-100 %] 99 % (05/05 0634) Weight:  [95.3 kg] 95.3 kg (05/04 1321)  afebrile, VSS Sat 100% [place] No labs  Intake/Output from previous day: 05/04 0701 - 05/05 0700 In: 1762.1 [P.O.:240; I.V.:1422.1; IV Piggyback:100] Out: 805 [Urine:800; Blood:5] Intake/Output this shift: No intake/output data recorded.  General appearance: alert, cooperative and no distress Resp: clear to auscultation bilaterally GI: soft, sore, sites all look good  Lab Results:  Recent Labs    06/06/18 1459  WBC 11.7*  HGB 13.7  HCT 41.7  PLT 195    BMET Recent Labs    06/06/18 1459  NA 137  K 3.9  CL 104  CO2 25  GLUCOSE 103*  BUN 13  CREATININE 1.04  CALCIUM 9.1   PT/INR No results for input(s): LABPROT, INR in the last 72 hours.  Recent Labs  Lab 06/06/18 1459  AST 17  ALT 17  ALKPHOS 48  BILITOT 0.6  PROT 7.6  ALBUMIN 4.3     Lipase     Component Value Date/Time   LIPASE 27 06/06/2018 1459     Prior to Admission medications   Medication Sig Start Date End Date Taking? Authorizing Provider  famotidine (PEPCID) 20 MG tablet Take 20 mg by mouth 2 (two) times daily.   Yes [provider]  fluticasone (FLONASE) 50 MCG/ACT nasal spray Place 1 spray into both nostrils daily.   Yes [provider]    Medications: . acetaminophen  1,000 mg Oral Q6H  . enoxaparin (LOVENOX) injection  40 mg Subcutaneous Q24H  . gabapentin  300 mg Oral BID  . senna-docusate  1 tablet Oral QHS   . dextrose 5 % and 0.45 % NaCl with KCl 20 mEq/L 75 mL/hr at 06/07/18 0540   Assessment/Plan   Acute  appendicitis Laparoscopic appendectomy, 06/06/18, Dr. Romie Levee  FEN: IV fluids/clear >> regular diet ID:  Rocephin/Flagyl pre op DVT:  Lovenox Follow up:  [facility] 5/19, @ 10:30 AM  Plan:  Advance diet, get him up and around, try pain medicines, and home later today.  . LOS: 0 days    William Chavez 06/07/2018 [phone]

## 2018-12-27 ENCOUNTER — Other Ambulatory Visit: Payer: Self-pay

## 2018-12-27 DIAGNOSIS — Z20822 Contact with and (suspected) exposure to covid-19: Secondary | ICD-10-CM

## 2018-12-29 LAB — NOVEL CORONAVIRUS, NAA: SARS-CoV-2, NAA: DETECTED — AB

## 2018-12-30 ENCOUNTER — Telehealth: Payer: Self-pay | Admitting: Unknown Physician Specialty

## 2018-12-30 NOTE — Telephone Encounter (Signed)
Discussed with pt positive test.  Instructed to quarantine for 10 days from symptoms

## 2018-12-30 NOTE — Telephone Encounter (Signed)
Left message about monoclonal antibody treatment .  However, pt does not qualify

## 2020-04-02 IMAGING — CT CT ABDOMEN AND PELVIS WITH CONTRAST
2 of 5 series · 16 of 46 positions shown, 18 images · IV contrast (OMNIPAQUE)
Comparison: None.

CLINICAL DATA: Generalized abdominal pain yesterday which since
moved to the right lower quadrant. Concern for appendicitis.

EXAM:
CT ABDOMEN AND PELVIS WITH CONTRAST
TECHNIQUE: Multidetector CT imaging of the abdomen and pelvis was performed
using the standard protocol following bolus administration of
intravenous contrast.
CONTRAST:  100mL OMNIPAQUE IOHEXOL 300 MG/ML  SOLN

[Series 2: axial st · axial · 0.87mm/px · z∈[+1123,+1558]mm · 13 of 101 slices shown, 15 images]
[im 7/101  soft-tissue]
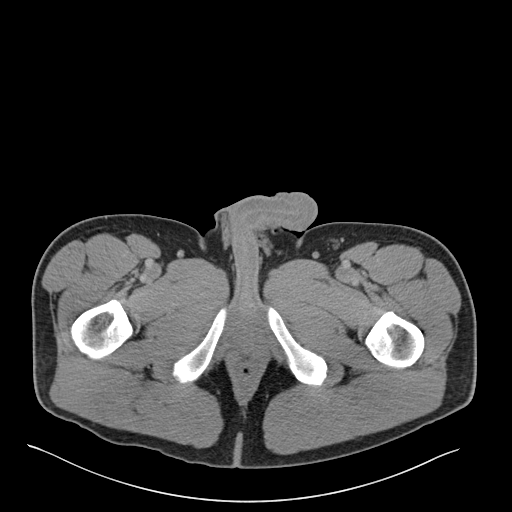
[im 7/101  bone]
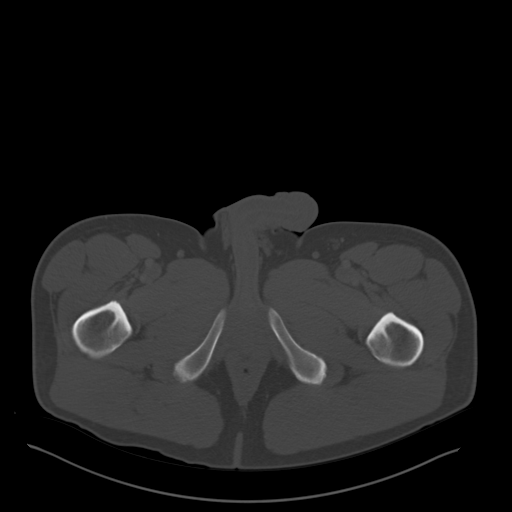
[im 14/101  soft-tissue]
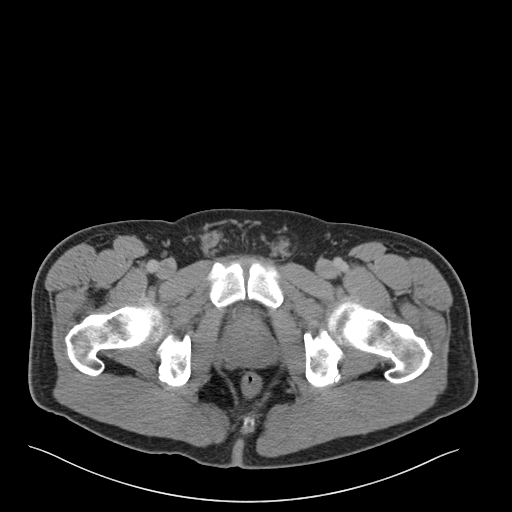
[im 21/101  soft-tissue]
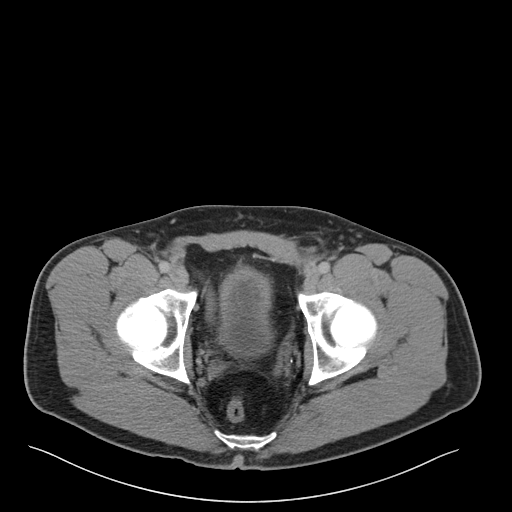
[im 27/101  soft-tissue]
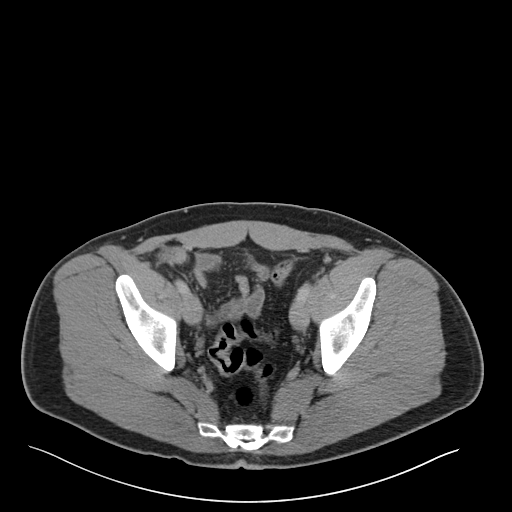
[im 34/101  soft-tissue]
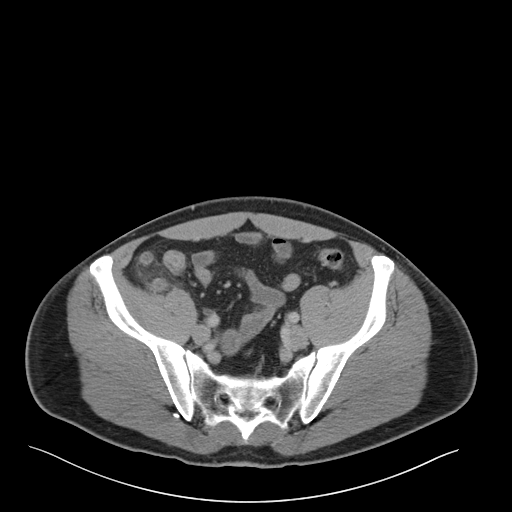
[im 41/101  soft-tissue]
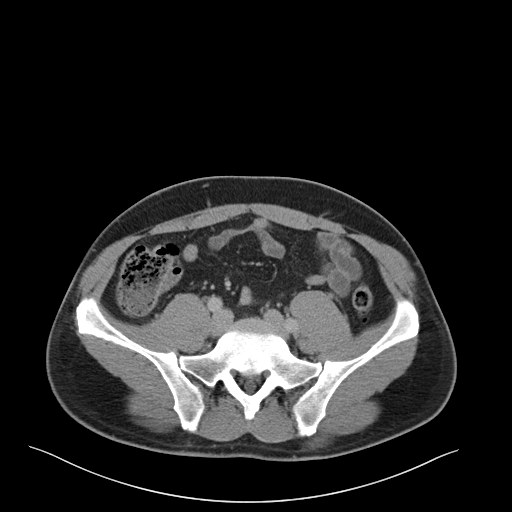
[im 54/101  soft-tissue]
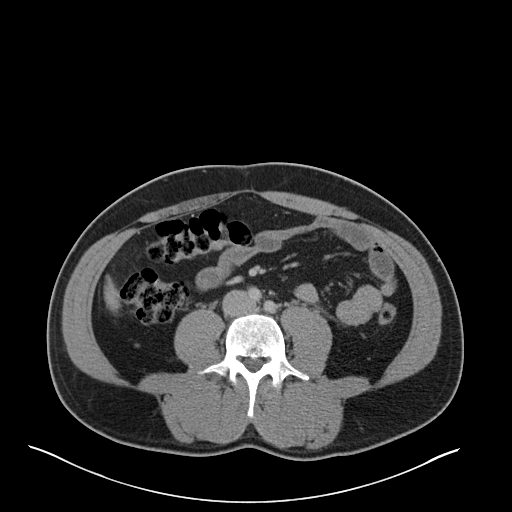
[im 61/101  soft-tissue]
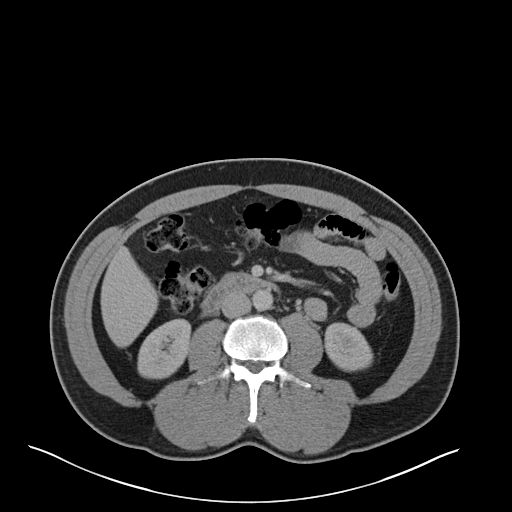
[im 67/101  soft-tissue]
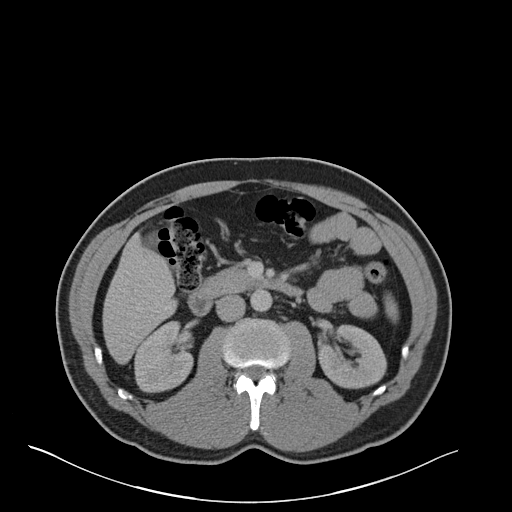
[im 67/101  bone]
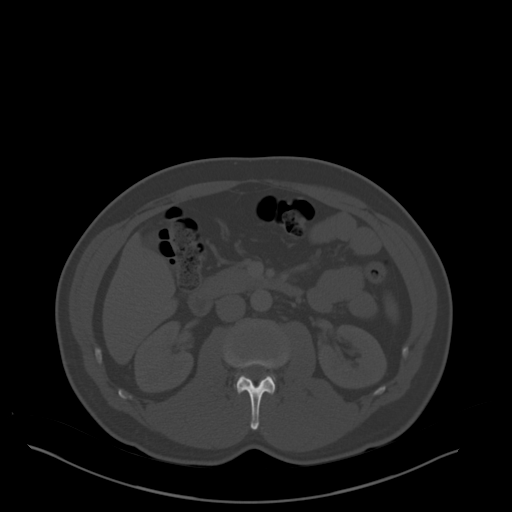
[im 74/101  soft-tissue]
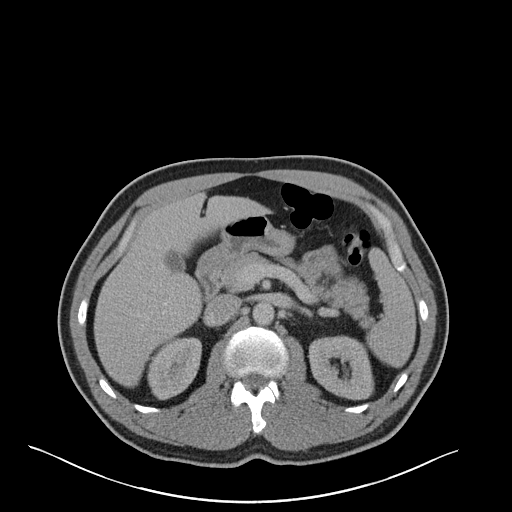
[im 81/101  soft-tissue]
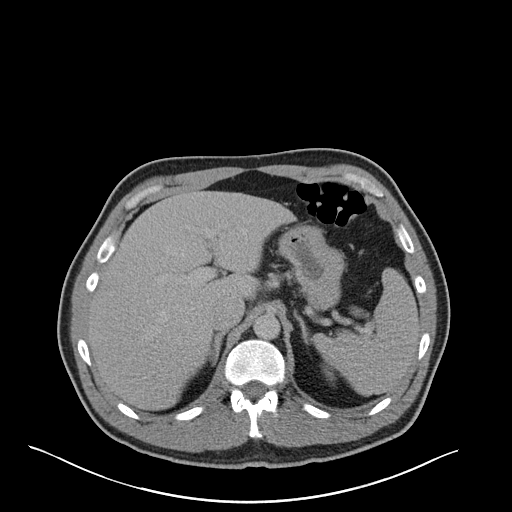
[im 87/101  soft-tissue]
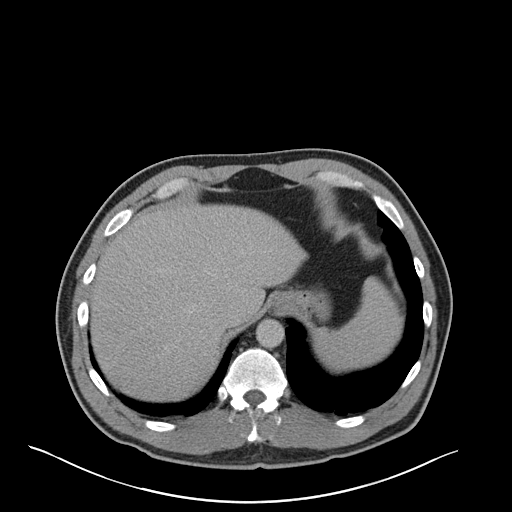
[im 94/101  soft-tissue]
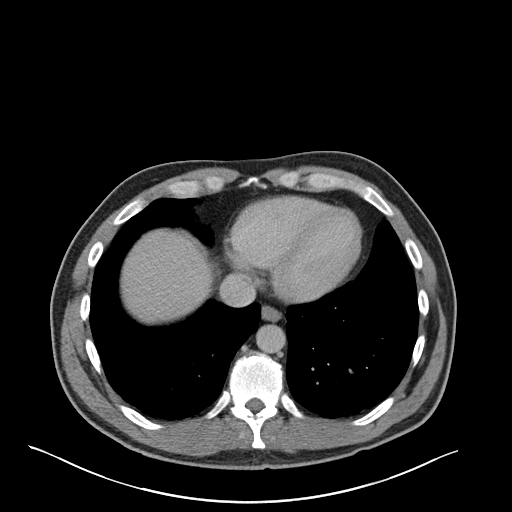

[Series 4: coronal st · coronal · 0.93mm/px · 3 of 135 slices shown]
[im 45/135  soft-tissue]
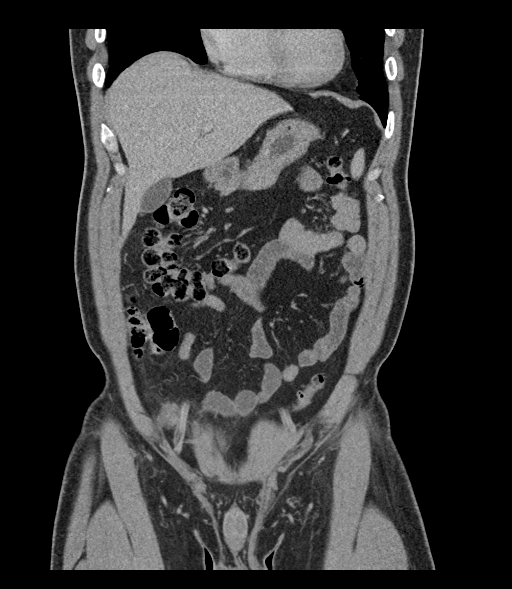
[im 60/135  soft-tissue]
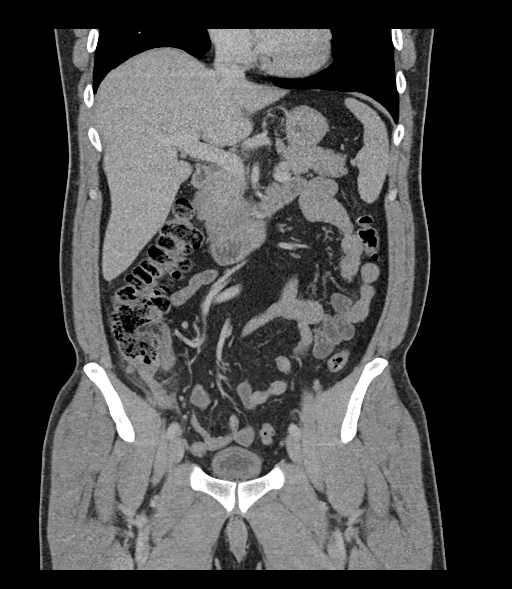
[im 75/135  soft-tissue]
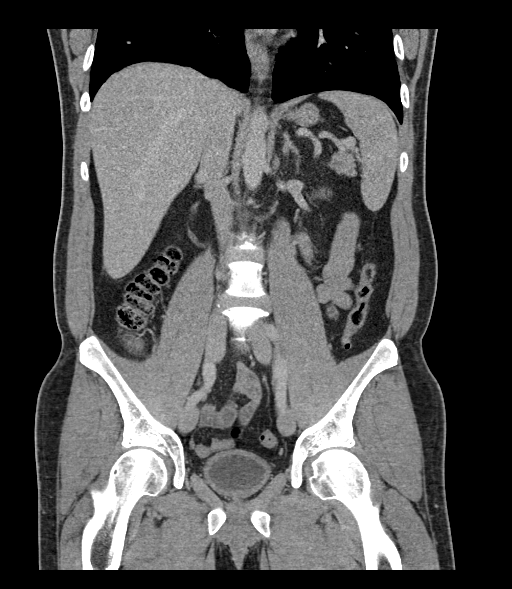

[16 of 46 positions shown; findings below may reference images not displayed]

FINDINGS: Lower chest: Lung bases demonstrate subtle dependent left basilar
atelectasis.

Hepatobiliary: Liver, gallbladder and biliary tree are normal.

Pancreas: Normal.

Spleen: Normal.

Adrenals/Urinary Tract: Adrenal glands are normal. Kidneys are
normal in size without hydronephrosis or nephrolithiasis. Ureters
and bladder are normal.

Stomach/Bowel: Stomach is normal. Minimal wall thickening of the
terminal ileum likely secondary to the adjacent inflammatory process
of the appendix. Colon is unremarkable.

Appendix is inflamed measuring up to 1.4 cm in diameter. Appendix is
located within the right lower quadrant and demonstrates mild
adjacent inflammatory change with mucosal enhancement and minimal
adjacent free fluid. There is a 4-5 mm appendicolith in the mid
appendix. No evidence of perforation or periappendiceal abscess.

Vascular/Lymphatic: Normal.

Reproductive: Normal.

Other: None.

Musculoskeletal: Unremarkable.
IMPRESSION: Evidence of acute appendicitis. No evidence of perforation or
periappendiceal abscess.

These results were called by telephone at the time of interpretation
on 06/06/2018 at [DATE] to Dr. Ulrich, who verbally acknowledged
these results.
